# Patient Record
Sex: Female | Born: 1990
Health system: Southern US, Community
[De-identification: ages and names within clinical notes are randomized; demographics above are authoritative.]

---
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
## 2019-10-18 ENCOUNTER — Encounter (HOSPITAL_COMMUNITY): Payer: Self-pay

## 2019-10-18 ENCOUNTER — Ambulatory Visit (HOSPITAL_COMMUNITY)
Admission: EM | Admit: 2019-10-18 | Discharge: 2019-10-18 | Disposition: A | Discharge status: Home / Self Care | Payer: BC Managed Care – PPO | Attending: Urgent Care | Admitting: Urgent Care

## 2019-10-18 ENCOUNTER — Other Ambulatory Visit: Payer: Self-pay

## 2019-10-18 DIAGNOSIS — N926 Irregular menstruation, unspecified: Secondary | ICD-10-CM

## 2019-10-18 DIAGNOSIS — N939 Abnormal uterine and vaginal bleeding, unspecified: Secondary | ICD-10-CM

## 2019-10-18 DIAGNOSIS — K5900 Constipation, unspecified: Secondary | ICD-10-CM | POA: Insufficient documentation

## 2019-10-18 DIAGNOSIS — Z3202 Encounter for pregnancy test, result negative: Secondary | ICD-10-CM

## 2019-10-18 DIAGNOSIS — K644 Residual hemorrhoidal skin tags: Secondary | ICD-10-CM | POA: Insufficient documentation

## 2019-10-18 LAB — POCT URINALYSIS DIP (DEVICE)
Glucose, UA: NEGATIVE mg/dL
Ketones, ur: 80 mg/dL — AB
Leukocytes,Ua: NEGATIVE
Nitrite: NEGATIVE
Protein, ur: NEGATIVE mg/dL
Specific Gravity, Urine: >=1.03 (ref 1.005–1.030)
Urobilinogen, UA: 0.2 mg/dL (ref 0.0–1.0)
pH: 6 (ref 5.0–8.0)

## 2019-10-18 LAB — POC URINE PREG, ED: Preg Test, Ur: NEGATIVE

## 2019-10-18 LAB — POCT PREGNANCY, URINE: Preg Test, Ur: NEGATIVE

## 2019-10-18 MED ORDER — HYDROCORTISONE ACETATE 25 MG RE SUPP: 25.0000 mg | Freq: Two times a day (BID) | RECTAL | 0 refills | Status: DC

## 2019-10-18 MED ORDER — DOCUSATE SODIUM 100 MG PO CAPS: 100.0000 mg | ORAL_CAPSULE | Freq: Two times a day (BID) | ORAL | 0 refills | Status: DC | PRN

## 2019-10-18 NOTE — ED Provider Notes (Signed)
[facility]   MRN: [id number] DOB: 10/20/1990  Subjective:   Katrina Wilson is a 29 y.o. female presenting for irregular vaginal bleeding.  Patient states that she had her regular cycle earlier this month with some mild vaginal spotting toward the end.  On Thursday and Friday, patient had bleeding as if it were her cycle again but did not have any this morning.  She admits that the past couple of months she has had 2 cycles per month.  She does not have any primary care provider, gynecologist.  Denies history of irregular cycles, endometriosis, uterine fibroids, irregular bleeding.  Patient is not on any contraception.  She is not currently taking any medications and has no known food or drug allergies.  Denies past medical and surgical history.   Family History  Problem Relation Age of Onset  . Hypertension Mother   . Diabetes Mother   . Hypertension Father     Social History   Tobacco Use  . Smoking status: Never Smoker  . Smokeless tobacco: Never Used  Substance Use Topics  . Alcohol use: Yes    Comment: occ  . Drug use: Not on file    Review of Systems  Constitutional: Negative for chills and fever.  Respiratory: Negative for shortness of breath.   Cardiovascular: Negative for chest pain.  Gastrointestinal: Positive for blood in stool and constipation. Negative for abdominal pain, diarrhea, nausea and vomiting.  Genitourinary: Negative for dysuria, flank pain, frequency, hematuria and urgency.  Musculoskeletal: Negative for myalgias.  Skin: Negative for rash.  Neurological: Negative for dizziness and headaches.     Objective:   Vitals: BP 136/89 (BP Location: Left Arm)   Pulse 81   Temp 98.6 F (37 C) (Oral)   Resp 16   LMP 10/03/2019 (Exact Date)   SpO2 100%   Physical Exam Constitutional:      General: She is not in acute distress.    Appearance: Normal appearance. She is well-developed and normal weight. She is not ill-appearing,  toxic-appearing or diaphoretic.  HENT:     Head: Normocephalic and atraumatic.     Right Ear: External ear normal.     Left Ear: External ear normal.     Nose: Nose normal.     Mouth/Throat:     Mouth: Mucous membranes are moist.     Pharynx: Oropharynx is clear.  Eyes:     General: No scleral icterus.    Extraocular Movements: Extraocular movements intact.     Pupils: Pupils are equal, round, and reactive to light.  Cardiovascular:     Rate and Rhythm: Normal rate and regular rhythm.     Pulses: Normal pulses.     Heart sounds: Normal heart sounds. No murmur. No friction rub. No gallop.   Pulmonary:     Effort: Pulmonary effort is normal. No respiratory distress.     Breath sounds: Normal breath sounds. No stridor. No wheezing, rhonchi or rales.  Abdominal:     General: Bowel sounds are normal. There is no distension.     Palpations: Abdomen is soft. There is no mass.     Tenderness: There is no abdominal tenderness. There is no right CVA tenderness, left CVA tenderness, guarding or rebound.  Genitourinary:    Labia:        Right: No rash, tenderness, lesion or injury.        Left: No rash, tenderness, lesion or injury.      Vagina: No signs of  injury and foreign body. No vaginal discharge, erythema, tenderness, bleeding, lesions or prolapsed vaginal walls.    Skin:    General: Skin is warm and dry.     Coloration: Skin is not pale.     Findings: No rash.  Neurological:     General: No focal deficit present.     Mental Status: She is alert and oriented to person, place, and time.  Psychiatric:        Mood and Affect: Mood normal.        Behavior: Behavior normal.        Thought Content: Thought content normal.        Judgment: Judgment normal.    Results for orders placed or performed during the hospital encounter of 10/18/19 (from the past 24 hour(s))  POCT urinalysis dip (device)     Status: Abnormal   Collection Time: 10/18/19 11:36 AM  Result Value Ref Range    Glucose, UA NEGATIVE NEGATIVE mg/dL   Bilirubin Urine SMALL (A) NEGATIVE   Ketones, ur 80 (A) NEGATIVE mg/dL   Specific Gravity, Urine >=1.030 1.005 - 1.030   Hgb urine dipstick MODERATE (A) NEGATIVE   pH 6.0 5.0 - 8.0   Protein, ur NEGATIVE NEGATIVE mg/dL   Urobilinogen, UA 0.2 0.0 - 1.0 mg/dL   Nitrite NEGATIVE NEGATIVE   Leukocytes,Ua NEGATIVE NEGATIVE     Assessment and Plan :   1. Vaginal bleeding   2. Irregular periods/menstrual cycles   3. Constipation, unspecified constipation type   4. External hemorrhoids     Emphasized need to hydrate well. Patient was agreeable to holding off on any medication for vaginal bleeding as it may now be resolved.  Recommend that she establish care with a gynecologist for further management and consideration of starting contraception.  Referral information provided to the patient including that for a new PCP. Counseled patient on potential for adverse effects with medications prescribed/recommended today, ER and return-to-clinic precautions discussed, patient verbalized understanding.    Jaynee Eagles, PA-C 10/18/19 1146

## 2019-10-18 NOTE — ED Triage Notes (Addendum)
Patient presents to Urgent Care with complaints of irregular vaginal bleeding since 2-3 months ago. Patient reports she noticed blood in her stool the past few days as well, states the toilet bowl is full of blood when she has a BM.  Pt states she does have a hemorrhoid that she believes is bleeding, blood appears bright red.

## 2019-10-19 LAB — URINE CULTURE: Culture: NO GROWTH

## 2019-10-22 LAB — CERVICOVAGINAL ANCILLARY ONLY
Bacterial vaginitis: NEGATIVE
Candida vaginitis: NEGATIVE
Chlamydia: NEGATIVE
Neisseria Gonorrhea: NEGATIVE
Trichomonas: NEGATIVE

## 2019-12-10 DIAGNOSIS — Z113 Encounter for screening for infections with a predominantly sexual mode of transmission: Secondary | ICD-10-CM | POA: Diagnosis not present

## 2020-06-10 DIAGNOSIS — Z20822 Contact with and (suspected) exposure to covid-19: Secondary | ICD-10-CM | POA: Diagnosis not present

## 2020-06-10 DIAGNOSIS — Z03818 Encounter for observation for suspected exposure to other biological agents ruled out: Secondary | ICD-10-CM | POA: Diagnosis not present

## 2020-07-20 DIAGNOSIS — R109 Unspecified abdominal pain: Secondary | ICD-10-CM | POA: Diagnosis not present

## 2020-07-20 DIAGNOSIS — Z20822 Contact with and (suspected) exposure to covid-19: Secondary | ICD-10-CM | POA: Diagnosis not present

## 2020-07-20 DIAGNOSIS — R6883 Chills (without fever): Secondary | ICD-10-CM | POA: Diagnosis not present

## 2020-07-20 DIAGNOSIS — R110 Nausea: Secondary | ICD-10-CM | POA: Diagnosis not present

## 2020-10-07 ENCOUNTER — Other Ambulatory Visit: Payer: Self-pay

## 2020-10-07 ENCOUNTER — Ambulatory Visit
Admission: RE | Admit: 2020-10-07 | Discharge: 2020-10-07 | Disposition: A | Discharge status: Home / Self Care | Payer: BC Managed Care – PPO | Source: Ambulatory Visit | Attending: Internal Medicine | Admitting: Internal Medicine

## 2020-10-07 VITALS — BP 140/95 | HR 77 | Temp 98.4°F | Resp 16 | Ht 68.0 in | Wt 205.0 lb

## 2020-10-07 DIAGNOSIS — J028 Acute pharyngitis due to other specified organisms: Secondary | ICD-10-CM | POA: Insufficient documentation

## 2020-10-07 DIAGNOSIS — Z1152 Encounter for screening for COVID-19: Secondary | ICD-10-CM | POA: Diagnosis not present

## 2020-10-07 LAB — POCT RAPID STREP A (OFFICE): Rapid Strep A Screen: NEGATIVE

## 2020-10-07 NOTE — Discharge Instructions (Signed)
Increase oral fluid intake Please quarantine until COVID-19 test results available If your symptoms worsen please return to the urgent care to be reevaluated Take medications as tolerated We will call you with recommendations if your labs are abnormal. 

## 2020-10-07 NOTE — ED Triage Notes (Signed)
Pt said nieces had strep throat and she hd been watching her. Now pt has had sore throat x 2days. No fever, slight headache

## 2020-10-08 NOTE — ED Provider Notes (Signed)
[facility]    CSN: [id number] Arrival date & time: 10/07/20  1546      History   Chief Complaint Chief Complaint  Patient presents with  . Sore Throat    HPI Katrina Wilson is a 30 y.o. female comes to urgent care with a 2-day history of sore throat.  Patient says symptoms started suddenly and has been persistent.  She denies any fever or chills.  She complains of slight headache.  No nausea, vomiting or diarrhea.  No loss of taste or smell.  Patient endorses a positive sick contact for strep throat.  She is not vaccinated against COVID-19 virus.   HPI  No past medical history on file.  There are no problems to display for this patient.   No past surgical history on file.  OB History   No obstetric history on file.      Home Medications    Prior to Admission medications   Medication Sig Start Date End Date Taking? Authorizing Provider  docusate sodium (COLACE) 100 MG capsule Take 1 capsule (100 mg total) by mouth 2 (two) times daily as needed for mild constipation or moderate constipation. 10/18/19   Wallis Bamberg, PA-C  hydrocortisone (ANUSOL-HC) 25 MG suppository Place 1 suppository (25 mg total) rectally 2 (two) times daily. 10/18/19   Wallis Bamberg, PA-C    Family History Family History  Problem Relation Age of Onset  . Hypertension Mother   . Diabetes Mother   . Hypertension Father     Social History Social History   Tobacco Use  . Smoking status: Never Smoker  . Smokeless tobacco: Never Used  Vaping Use  . Vaping Use: Never used  Substance Use Topics  . Alcohol use: Yes    Comment: occ     Allergies   Patient has no known allergies.   Review of Systems Review of Systems  Constitutional: Positive for fatigue. Negative for activity change, chills and fever.  HENT: Positive for sore throat.   Respiratory: Negative for cough, shortness of breath and wheezing.   Neurological: Negative.      Physical Exam Triage Vital  Signs ED Triage Vitals  Enc Vitals Group     BP 10/07/20 1615 (!) 140/95     Pulse Rate 10/07/20 1615 77     Resp 10/07/20 1615 16     Temp 10/07/20 1615 98.4 F (36.9 C)     Temp Source 10/07/20 1615 Oral     SpO2 10/07/20 1615 98 %     Weight 10/07/20 1617 205 lb (93 kg)     Height 10/07/20 1617 5\' 8"  (1.727 m)     Head Circumference --      Peak Flow --      Pain Score 10/07/20 1617 4     Pain Loc --      Pain Edu? --      Excl. in GC? --    No data found.  Updated Vital Signs BP (!) 140/95 (BP Location: Right Arm)   Pulse 77   Temp 98.4 F (36.9 C) (Oral)   Resp 16   Ht 5\' 8"  (1.727 m)   Wt 93 kg   LMP 09/24/2020   SpO2 98%   BMI 31.17 kg/m   Visual Acuity Right Eye Distance:   Left Eye Distance:   Bilateral Distance:    Right Eye Near:   Left Eye Near:    Bilateral Near:     Physical Exam Vitals  and nursing note reviewed.  Constitutional:      General: She is not in acute distress.    Appearance: She is not ill-appearing.  HENT:     Right Ear: Tympanic membrane normal.     Left Ear: Tympanic membrane normal.     Mouth/Throat:     Mouth: Mucous membranes are moist. Mucous membranes are pale.  Cardiovascular:     Rate and Rhythm: Normal rate and regular rhythm.  Pulmonary:     Effort: Pulmonary effort is normal.     Breath sounds: Normal breath sounds.  Musculoskeletal:     Cervical back: Normal range of motion.  Neurological:     Mental Status: She is alert.      UC Treatments / Results  Labs (all labs ordered are listed, but only abnormal results are displayed) Labs Reviewed  CULTURE, GROUP A STREP ([facility])  NOVEL CORONAVIRUS, NAA  POCT RAPID STREP A (OFFICE)    EKG   Radiology No results found.  Procedures Procedures (including critical care time)  Medications Ordered in UC Medications - No data to display  Initial Impression / Assessment and Plan / UC Course  I have reviewed the triage vital signs and the nursing  notes.  Pertinent labs & imaging results that were available during my care of the patient were reviewed by me and considered in my medical decision making (see chart for details).     1. Acute pharyngitis: Point-of-care strep is negative Throat culture sent Warm salt water gargle COVID-19 PCR test has been sent Patient is advised to quarantine until COVID-19 test results are available If symptoms worsen patient is advised to return to urgent care to be reevaluated. Final Clinical Impressions(s) / UC Diagnoses   Final diagnoses:  Acute pharyngitis due to other specified organisms     Discharge Instructions     Increase oral fluid intake Please quarantine until COVID-19 test results available If your symptoms worsen please return to the urgent care to be reevaluated Take medications as tolerated We will call you with recommendations if your labs are abnormal.   ED Prescriptions    None     PDMP not reviewed this encounter.   Merrilee Jansky, MD 10/08/20 [phone]

## 2020-10-09 LAB — SARS-COV-2, NAA 2 DAY TAT

## 2020-10-09 LAB — NOVEL CORONAVIRUS, NAA: SARS-CoV-2, NAA: DETECTED — AB

## 2020-10-10 LAB — CULTURE, GROUP A STREP (THRC)

## 2020-10-12 ENCOUNTER — Ambulatory Visit: Payer: Self-pay | Admitting: *Deleted

## 2020-10-12 NOTE — Telephone Encounter (Signed)
Pt called in for the results of her covid test and strep throat test from her ED visit.   I let her know she was positive for covid and went over the care advice with her. Our call got disconnected however I had gone over the protocol and all with her.  I let her know the health dept would be notified then the line was disconnected.    Reason for Disposition . [1] COVID-19 diagnosed by doctor (or NP/PA) AND [2] mild symptoms (e.g., cough, fever, others) AND [3] no complications or SOB  Answer Assessment - Initial Assessment Questions 1. COVID-19 DIAGNOSIS: "Who made your COVID-19 diagnosis?" "Was it confirmed by a positive lab test?" If not diagnosed by a HCP, ask "Are there lots of cases (community spread) where you live?" Note: See public health department website, if unsure.     Ed was tested now positive for covid. 2. COVID-19 EXPOSURE: "Was there any known exposure to COVID before the symptoms began?" CDC Definition of close contact: within 6 feet (2 meters) for a total of 15 minutes or more over a 24-hour period.      symptoms 3. ONSET: "When did the COVID-19 symptoms start?"      Last Wed.   4. WORST SYMPTOM: "What is your worst symptom?" (e.g., cough, fever, shortness of breath, muscle aches)     Sore throat. 5. COUGH: "Do you have a cough?" If Yes, ask: "How bad is the cough?"       No 6. FEVER: "Do you have a fever?" If Yes, ask: "What is your temperature, how was it measured, and when did it start?"     No 7. RESPIRATORY STATUS: "Describe your breathing?" (e.g., shortness of breath, wheezing, unable to speak)      Fine 8. BETTER-SAME-WORSE: "Are you getting better, staying the same or getting worse compared to yesterday?"  If getting worse, ask, "In what way?"     Better 9. HIGH RISK DISEASE: "Do you have any chronic medical problems?" (e.g., asthma, heart or lung disease, weak immune system, obesity, etc.)     Call got disconnected at this point.   I had gone over the quarantine  protocol with her at this point. 10. VACCINE: "Have you gotten the COVID-19 vaccine?" If Yes ask: "Which one, how many shots, when did you get it?"       Not vaccinated for covid 11. PREGNANCY: "Is there any chance you are pregnant?" "When was your last menstrual period?"       Not asked 12. OTHER SYMPTOMS: "Do you have any other symptoms?"  (e.g., chills, fatigue, headache, loss of smell or taste, muscle pain, sore throat; new loss of smell or taste especially support the diagnosis of COVID-19)       Sore throat was only symptom she had.  Protocols used: CORONAVIRUS (COVID-19) DIAGNOSED OR SUSPECTED-A-AH

## 2020-10-22 DIAGNOSIS — Z114 Encounter for screening for human immunodeficiency virus [HIV]: Secondary | ICD-10-CM | POA: Diagnosis not present

## 2020-10-22 DIAGNOSIS — Z113 Encounter for screening for infections with a predominantly sexual mode of transmission: Secondary | ICD-10-CM | POA: Diagnosis not present

## 2020-10-22 DIAGNOSIS — N760 Acute vaginitis: Secondary | ICD-10-CM | POA: Diagnosis not present

## 2020-11-26 ENCOUNTER — Ambulatory Visit: Payer: BC Managed Care – PPO | Admitting: Obstetrics & Gynecology

## 2021-09-14 ENCOUNTER — Ambulatory Visit (INDEPENDENT_AMBULATORY_CARE_PROVIDER_SITE_OTHER): Payer: BC Managed Care – PPO | Admitting: Obstetrics & Gynecology

## 2021-09-14 ENCOUNTER — Other Ambulatory Visit (HOSPITAL_COMMUNITY)
Admission: RE | Admit: 2021-09-14 | Discharge: 2021-09-14 | Disposition: A | Discharge status: Home / Self Care | Payer: BC Managed Care – PPO | Source: Ambulatory Visit | Attending: Obstetrics & Gynecology | Admitting: Obstetrics & Gynecology

## 2021-09-14 ENCOUNTER — Encounter: Payer: Self-pay | Admitting: Obstetrics & Gynecology

## 2021-09-14 ENCOUNTER — Other Ambulatory Visit: Payer: Self-pay

## 2021-09-14 VITALS — BP 135/78 | HR 60 | Ht 68.0 in | Wt 216.0 lb

## 2021-09-14 DIAGNOSIS — Z113 Encounter for screening for infections with a predominantly sexual mode of transmission: Secondary | ICD-10-CM

## 2021-09-14 DIAGNOSIS — N939 Abnormal uterine and vaginal bleeding, unspecified: Secondary | ICD-10-CM | POA: Diagnosis not present

## 2021-09-14 NOTE — Progress Notes (Signed)
Patient complaining about irregular periods. Patient states she has multiple periods in a month. Patient states that periods last 3-5 days. Patient was about 37-28 years old when she started menstruation. Patient reports last pap smear was Oct 22, 2020 at [facility].Armandina Stammer RN

## 2021-09-14 NOTE — Progress Notes (Signed)
History:  30 y.o. G0P0000 here today for irreg menses for 5 months. Prior to that pt reports monthly cycles lasting 5 days. Pt reports 2-3 'cycles' per month. She is a G0. Pt denies being sexually active since Jan.2021. Was prev sexually active males. She reports being screened for STIs in Jan [facility] at the [facility]. She used condoms for protection. She denies prev medical problems. She also denies pain with menses or vaginal discharge.     The following portions of the patient's history were reviewed and updated as appropriate: allergies, current medications, past family history, past medical history, past social history, past surgical history and problem list.  Review of Systems:  Pertinent items are noted in HPI.    Objective:  Physical Exam Blood pressure 135/78, pulse 60, height 5\' 8"  (1.727 m), weight 216 lb (98 kg).  CONSTITUTIONAL: Well-developed, well-nourished female in no acute distress.  HENT:  Normocephalic, atraumatic EYES: Conjunctivae and EOM are normal. No scleral icterus.  NECK: Normal range of motion SKIN: Skin is warm and dry. No rash noted. Not diaphoretic.No pallor. NEUROLGIC: Alert and oriented to person, place, and time. Normal coordination.  Abd: Soft, nontender and nondistended; umbilical piercings.  Pelvic: Normal appearing external genitalia; normal appearing vaginal mucosa and cervix.  Normal discharge.  Small uterus, no other palpable masses, no uterine or adnexal tenderness   Assessment & Plan:  Stehanie was seen today for menstrual problem.  Diagnoses and all orders for this visit:  Abnormal uterine bleeding -     Pelvis Complete; Future -     Cervicovaginal ancillary only( [place]) -     TSH -     CBC  Routine screening for STI (sexually transmitted infection)   Pt was offered OCPs to control her cycle. She wants to wait for the results to come back. She is on MyChart and is ok to communicate via MyChart.  F/u in 3 months in ofc.   Total  face-to-face time with patient, review of chart, discussion with consultant and coordination of care was [place].    Jamey Harman L. Harraway-Smith, M.D., 

## 2021-09-15 LAB — CBC
Hematocrit: 35.4 % (ref 34.0–46.6)
Hemoglobin: 10.7 g/dL — ABNORMAL LOW (ref 11.1–15.9)
MCH: 24.7 pg — ABNORMAL LOW (ref 26.6–33.0)
MCHC: 30.2 g/dL — ABNORMAL LOW (ref 31.5–35.7)
MCV: 82 fL (ref 79–97)
Platelets: 270 10*3/uL (ref 150–450)
RBC: 4.33 x10E6/uL (ref 3.77–5.28)
RDW: 15 % (ref 11.7–15.4)
WBC: 6.4 10*3/uL (ref 3.4–10.8)

## 2021-09-15 LAB — TSH: TSH: 1.63 u[IU]/mL (ref 0.450–4.500)

## 2021-09-16 LAB — CERVICOVAGINAL ANCILLARY ONLY
Chlamydia: NEGATIVE
Comment: NEGATIVE
Comment: NEGATIVE
Comment: NORMAL
Neisseria Gonorrhea: NEGATIVE
Trichomonas: NEGATIVE

## 2021-09-17 ENCOUNTER — Ambulatory Visit (HOSPITAL_BASED_OUTPATIENT_CLINIC_OR_DEPARTMENT_OTHER)
Admission: RE | Admit: 2021-09-17 | Discharge: 2021-09-17 | Disposition: A | Discharge status: Home / Self Care | Payer: BC Managed Care – PPO | Source: Ambulatory Visit | Attending: Obstetrics & Gynecology | Admitting: Obstetrics & Gynecology

## 2021-09-17 ENCOUNTER — Encounter (HOSPITAL_BASED_OUTPATIENT_CLINIC_OR_DEPARTMENT_OTHER): Payer: Self-pay

## 2021-09-17 ENCOUNTER — Other Ambulatory Visit: Payer: Self-pay

## 2021-09-17 DIAGNOSIS — N939 Abnormal uterine and vaginal bleeding, unspecified: Secondary | ICD-10-CM

## 2021-09-22 ENCOUNTER — Ambulatory Visit (HOSPITAL_BASED_OUTPATIENT_CLINIC_OR_DEPARTMENT_OTHER)
Admission: RE | Admit: 2021-09-22 | Discharge: 2021-09-22 | Disposition: A | Discharge status: Home / Self Care | Payer: BC Managed Care – PPO | Source: Ambulatory Visit | Attending: Obstetrics & Gynecology | Admitting: Obstetrics & Gynecology

## 2021-09-22 ENCOUNTER — Other Ambulatory Visit: Payer: Self-pay

## 2021-09-22 DIAGNOSIS — N939 Abnormal uterine and vaginal bleeding, unspecified: Secondary | ICD-10-CM | POA: Insufficient documentation

## 2021-09-22 DIAGNOSIS — R9389 Abnormal findings on diagnostic imaging of other specified body structures: Secondary | ICD-10-CM | POA: Diagnosis not present

## 2021-09-22 DIAGNOSIS — N8500 Endometrial hyperplasia, unspecified: Secondary | ICD-10-CM | POA: Diagnosis not present

## 2021-09-22 DIAGNOSIS — R188 Other ascites: Secondary | ICD-10-CM | POA: Diagnosis not present

## 2021-12-14 ENCOUNTER — Encounter: Payer: Self-pay | Admitting: Obstetrics & Gynecology

## 2021-12-14 ENCOUNTER — Ambulatory Visit (INDEPENDENT_AMBULATORY_CARE_PROVIDER_SITE_OTHER): Payer: BC Managed Care – PPO | Admitting: Obstetrics & Gynecology

## 2021-12-14 ENCOUNTER — Other Ambulatory Visit: Payer: Self-pay

## 2021-12-14 VITALS — BP 138/89 | HR 70 | Wt 206.0 lb

## 2021-12-14 DIAGNOSIS — N939 Abnormal uterine and vaginal bleeding, unspecified: Secondary | ICD-10-CM | POA: Diagnosis not present

## 2021-12-14 MED ORDER — NORETHINDRONE ACET-ETHINYL EST 1-20 MG-MCG PO TABS: 1.0000 | ORAL_TABLET | Freq: Every day | ORAL | 11 refills | Status: AC

## 2021-12-14 NOTE — Progress Notes (Signed)
History:  ?31 y.o. G0P0000 here today for f/u of [place] and labs.  Pt reports continued abnormal bleeding.  ? ?The following portions of the patient's history were reviewed and updated as appropriate: allergies, current medications, past family history, past medical history, past social history, past surgical history and problem list. ? ?Review of Systems:  ?Pertinent items are noted in HPI. ?   ?Objective:  ?Physical Exam ?Blood pressure 138/89, pulse 70, weight 206 lb (93.4 kg), last menstrual period 12/01/2021. ? ?CONSTITUTIONAL: Well-developed, well-nourished female in no acute distress.  ?HENT:  Normocephalic, atraumatic ?EYES: Conjunctivae and EOM are normal. No scleral icterus.  ?NECK: Normal range of motion ?SKIN: Skin is warm and dry. No rash noted. Not diaphoretic.No pallor. ?NEUROLGIC: Alert and oriented to person, place, and time. Normal coordination.   ?Pelvic exam: deferred ? ?Labs and Imaging ?09/22/2021 ?CLINICAL DATA:  Abnormal vaginal bleeding. ?  ?EXAM: ?TRANSABDOMINAL ULTRASOUND OF PELVIS ?  ?TECHNIQUE: ?Transabdominal ultrasound examination of the pelvis was performed ?including evaluation of the uterus, ovaries, adnexal regions, and ?pelvic cul-de-sac. ?  ?COMPARISON:  None. ?  ?FINDINGS: ?Uterus ?  ?Measurements: 8.4 cm x 4.8 cm x 5.7 cm = volume: 121.5 mL. No ?fibroids or other mass visualized. ?  ?Endometrium ?  ?Thickness: 15.0 mm.  No focal abnormality visualized. ?  ?Right ovary ?  ?Measurements: 2.7 cm x 2.2 cm x 1.6 cm = volume: 4.8 mL. Normal ?appearance/no adnexal mass. ?  ?Left ovary ?  ?Measurements: 3.7 cm x 1.9 cm x 1.9 cm = volume: 3.2 mL. Normal ?appearance/no adnexal mass. ?  ?Other findings:  A trace amount of pelvic free fluid is seen. ?  ?IMPRESSION: ?1. Thickened endometrium. ?2. Trace amount of pelvic free fluid, likely physiologic. ?  ? ?Assessment & Plan:  ?AUB- ? ?Kaydence was seen today for follow-up. ? ?Diagnoses and all orders for this visit: ? ?Abnormal uterine bleeding  (AUB) ?-     norethindrone-ethinyl estradiol (LOESTRIN 1/20, 21,) 1-20 MG-MCG tablet; Take 1 tablet by mouth daily. ? ? F/u 3 months.  ? ?Zaneta Lightcap L. Harraway-Smith, M.D., FACOG ? ? ?

## 2021-12-14 NOTE — Patient Instructions (Signed)
Oral Contraception Information Oral contraceptive pills (OCPs) are medicines taken by mouth to prevent pregnancy. They work by: Preventing the ovaries from releasing eggs. Thickening mucus in the lower part of the uterus (cervix). This prevents sperm from entering the uterus. Thinning the lining of the uterus (endometrium). This prevents a fertilized egg from attaching to the endometrium. OCPs are highly effective when taken exactly as prescribed. However, OCPs do not prevent STIs (sexually transmitted infections). Using condoms while on an OCP can help prevent STIs. What happens before starting OCPs? Before you start taking OCPs: You may have a physical exam, blood test, and Pap test. Your health care provider will make sure you are a good candidate for oral contraception. OCPs are not a good option for certain women, such as: Women who smoke and are older than age 35. Women who have or have had certain conditions, such as: A history of high blood pressure. Deep vein thrombosis. Pulmonary embolism. Stroke. Cardiovascular disease. Peripheral vascular disease. Ask your health care provider about the possible side effects of the OCP you may be prescribed. Be aware that it can take 2-3 months for your body to adjustto changes in hormone levels. Types of oral contraception  Birth control pills contain the hormones estrogen and progestin (synthetic progesterone) or progestin only. The combination pill This type of pill contains estrogen and progestin hormones. Conventional contraception pills come in packs of 21 or 28 pills. Some packs with 28-day pills contain estrogen and progestin for the first 21-24 days. Hormone-free tablets, called placebos, are taken for the final 4-7 days. You should have menstrual bleeding during the time you take the placebos. In packs with 21 tablets, you take no pills for 7 days. Menstrual bleeding occurs during these days. (Some people prefer taking a pill for 28  days to help establish a routine). Extended-interval contraception pills come in packs of 91 pills. The first 84 tablets have both estrogen and progestin. The last 7 pills are placebos. Menstrual bleeding occurs during the placebo days. With this schedule, menstrual bleeding happens once every 3 months. Continuous contraception pills come in packs of 28 pills. All pills in the pack contain estrogen and progestin. With this schedule, regular menstrual bleeding does not happen, but there may be spotting or irregular bleeding. Progestin-only pills This type of pill is often called the mini-pill and contains the progestin hormone only. It comes in packs of 28 pills. In some packs, the last 4 pills are placebos. The pill must be taken at the same time every day. This is very important to prevent pregnancy. Menstrual bleeding may not be regular orpredictable. What are the advantages? Oral contraception provides reliable and continuous contraception if taken as directed. It may treat or decrease symptoms of: Menstrual period cramps. Irregular menstrual cycle or bleeding. Heavy menstrual flow. Abnormal uterine bleeding. Acne, depending on the type of pill. Polycystic ovarian syndrome (POS). Endometriosis. Iron deficiency anemia. Premenstrual symptoms, including severe irritability, depression, or anxiety. It also may: Reduce the risk of endometrial and ovarian cancer. Be used as emergency contraception. Prevent ectopic pregnancies and infections of the fallopian tubes. What can make OCPs less effective? OCPs may be less effective if: You forget to take the pill every day. For progestin-only pills, it is especially important to take the pill at the same time each day. Even taking it 3 hours late can increase the risk of pregnancy. You have a stomach or intestinal disease that reduces your body's ability to absorb the pill. You take   OCPs with other medicines that make OCPs less effective, such as  antibiotics, certain HIV medicines, and some seizure medicines. You take expired OCPs. You forget to restart the pill after 7 days of not taking it. This refers to the packs of 21 pills. What are the side effects and risks? OCPs can sometimes cause side effects, such as: Headache. Depression. Trouble sleeping. Nausea and vomiting. Breast tenderness. Irregular bleeding or spotting during the first several months. Bloating or fluid retention. Increase in blood pressure. Combination pills may slightly increase the risk of: Blood clots. Heart attack. Stroke. Follow these instructions at home: Follow instructions from your health care provider about how to start taking your first cycle of OCPs. Depending on when you start the pill, you may need to use a backup form of birth control, such as condoms, during the first week.Make sure you know what steps to take if you forget to take the pill. Summary Oral contraceptive pills (OCPs) are medicines taken by mouth to prevent pregnancy. They are highly effective when taken exactly as prescribed. OCPs contain a combination of the hormones estrogen and progestin (synthetic progesterone) or progestin only. Before you start taking the pill, you may have a physical exam, blood test, and Pap test. Your health care provider will make sure you are a good candidate for oral contraception. The combination pill may come in a 21-day pack, a 28-day pack, or a 91-day pack. Progestin-only pills come in packs of 28 pills. OCPs can sometimes cause side effects, such as headache, nausea, breast tenderness, or irregular bleeding. This information is not intended to replace advice given to you by your health care provider. Make sure you discuss any questions you have with your healthcare provider. Document Revised: 06/18/2020 Document Reviewed: 05/27/2020 Elsevier Patient Education  2022 [employer]  

## 2022-01-05 ENCOUNTER — Encounter: Payer: Self-pay | Admitting: Obstetrics & Gynecology

## 2022-05-05 DIAGNOSIS — Z114 Encounter for screening for human immunodeficiency virus [HIV]: Secondary | ICD-10-CM | POA: Diagnosis not present

## 2022-05-05 DIAGNOSIS — Z113 Encounter for screening for infections with a predominantly sexual mode of transmission: Secondary | ICD-10-CM | POA: Diagnosis not present

## 2022-11-15 IMAGING — US US PELVIS COMPLETE
1 series · 14 of 25 positions shown · non-contrast
Comparison: None.

CLINICAL DATA: Abnormal vaginal bleeding.

EXAM:
TRANSABDOMINAL ULTRASOUND OF PELVIS
TECHNIQUE: Transabdominal ultrasound examination of the pelvis was performed
including evaluation of the uterus, ovaries, adnexal regions, and
pelvic cul-de-sac.

[Series 1: us pelvis complete · 46 acquisitions, 14 frames shown]
[im 1/46]
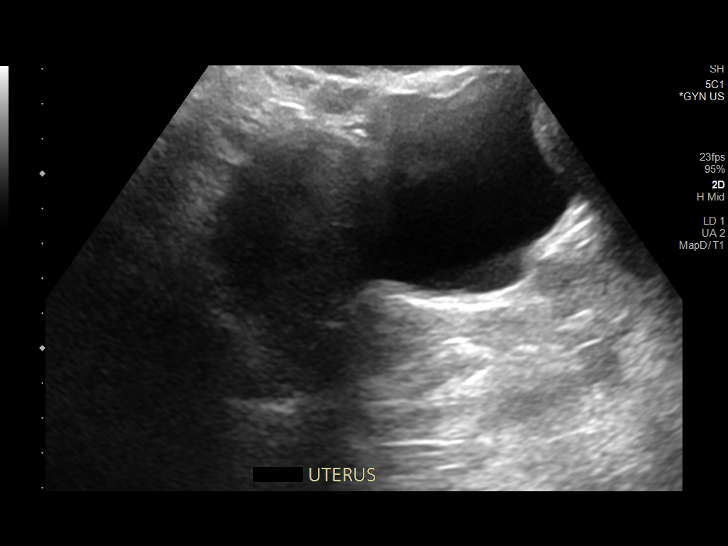
[im 4/46]
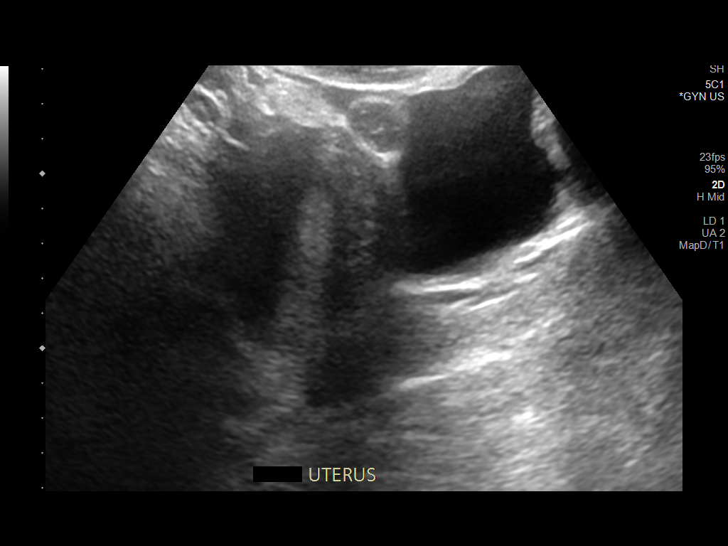
[im 8/46]
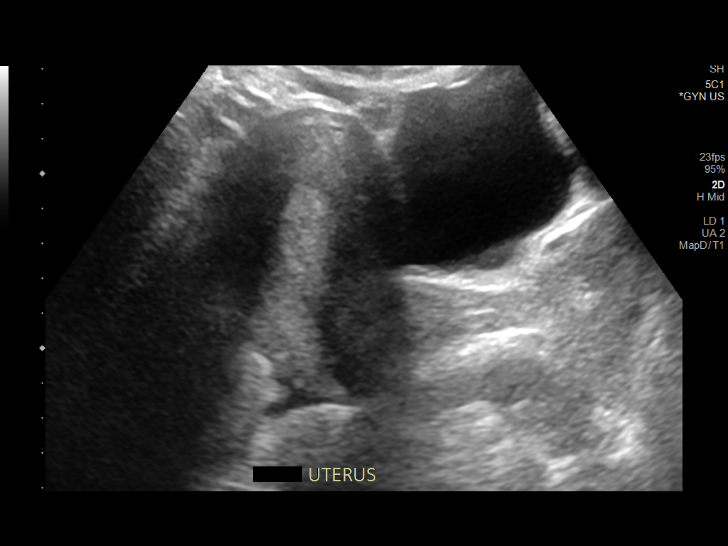
[im 12/46]
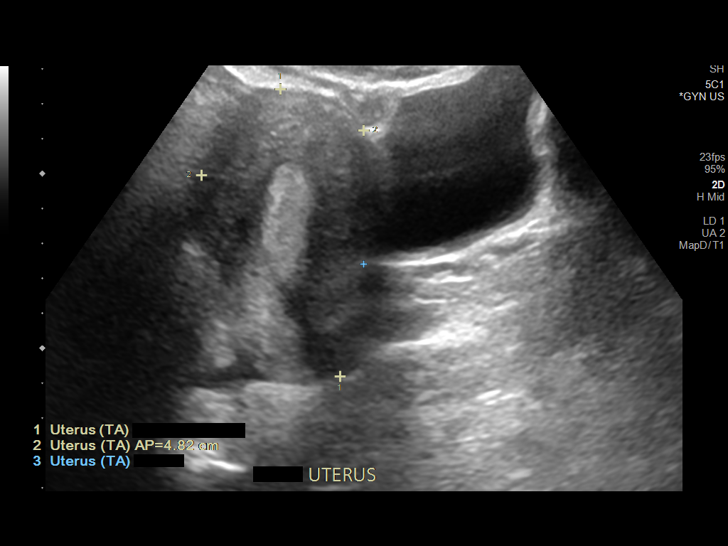
[im 16/46]
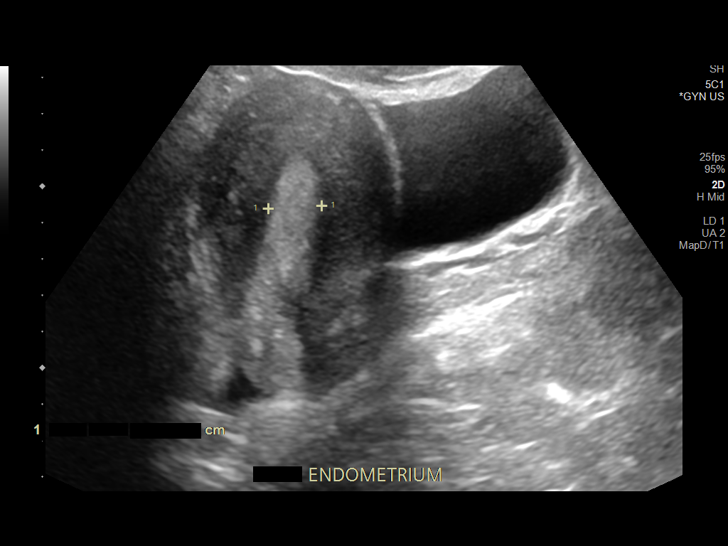
[im 17/46]
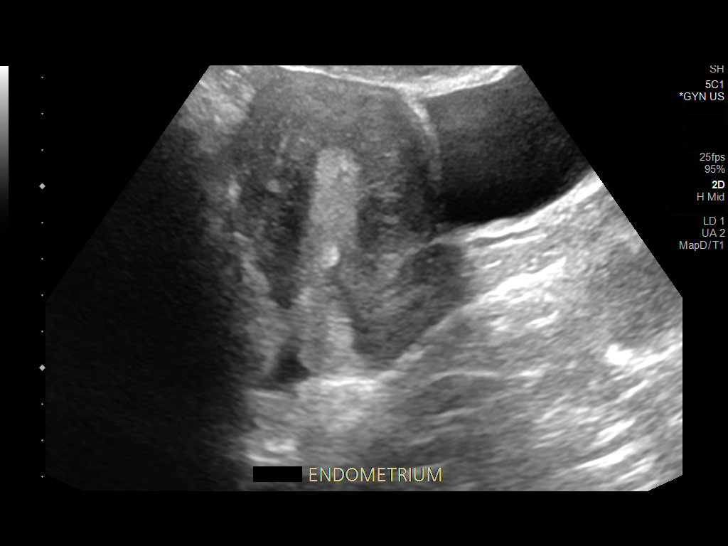
[im 21/46]
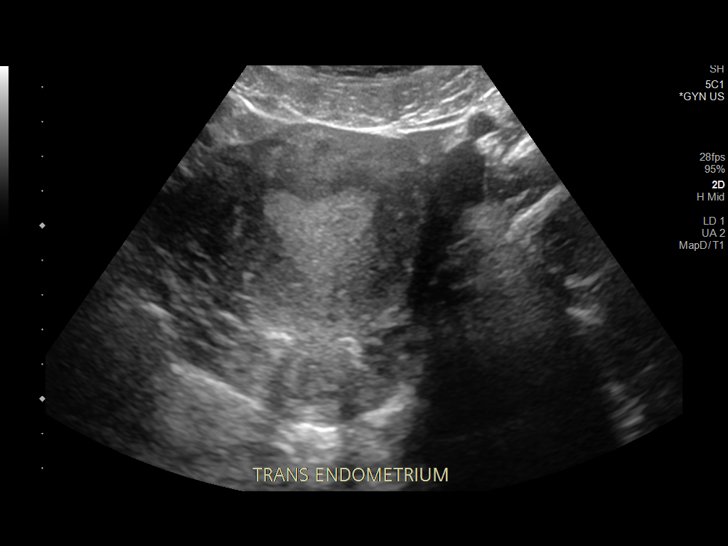
[im 25/46]
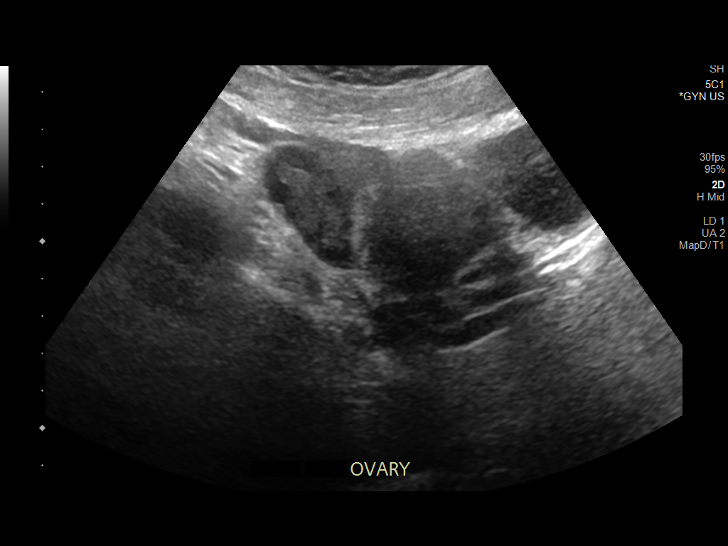
[im 29/46]
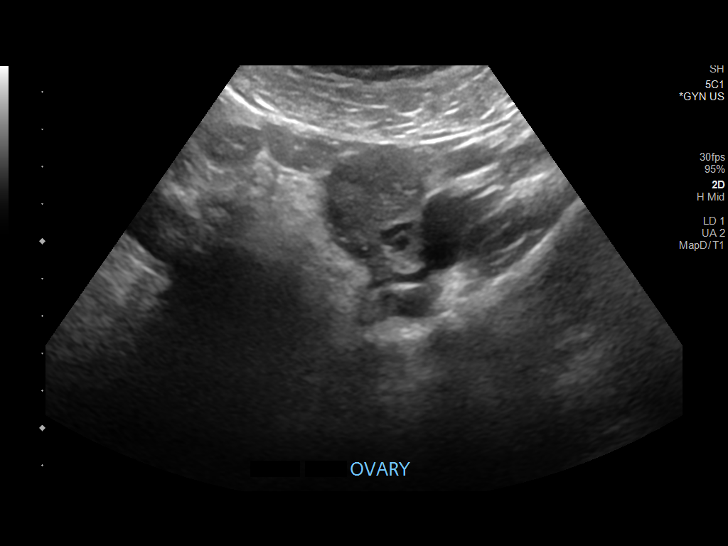
[im 31/46]
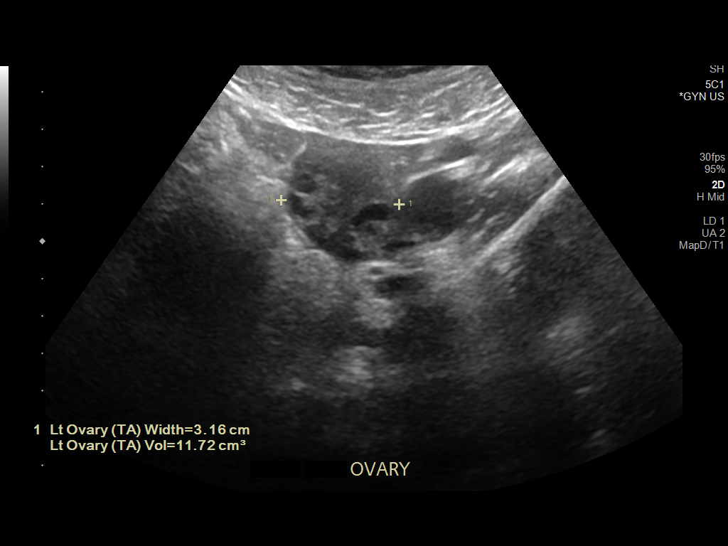
[im 34/46]
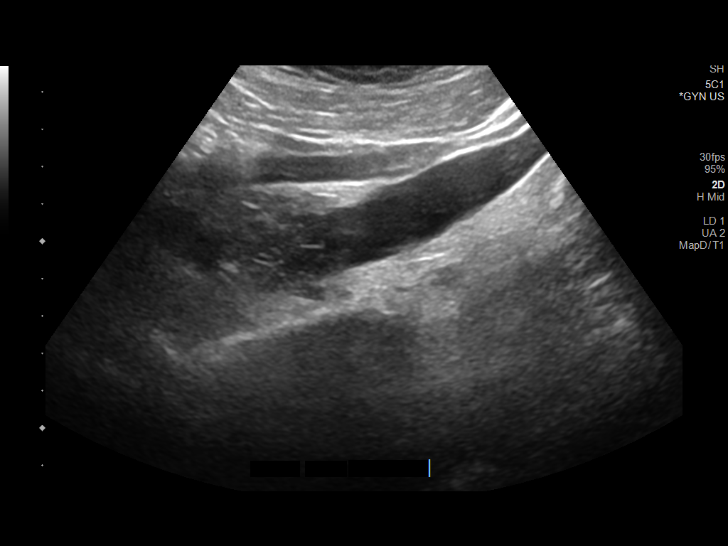
[im 38/46]
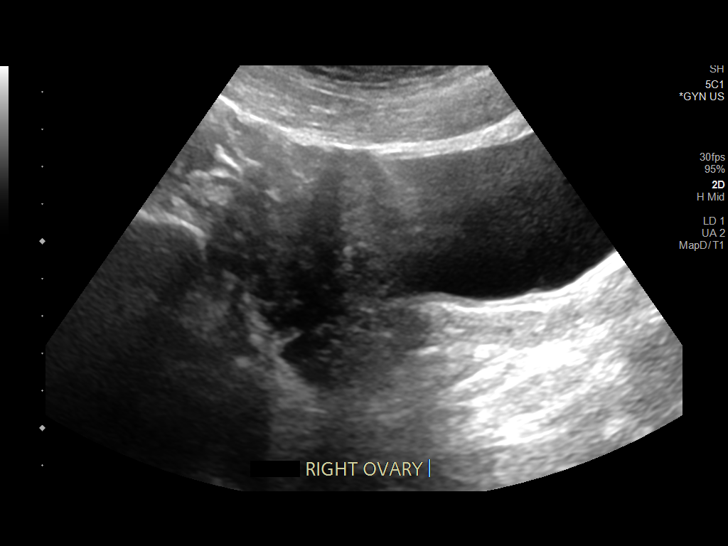
[im 42/46]
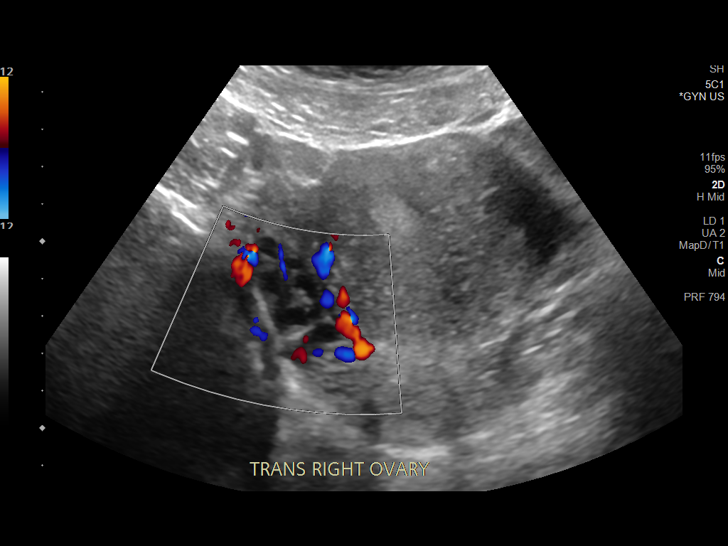
[im 46/46]
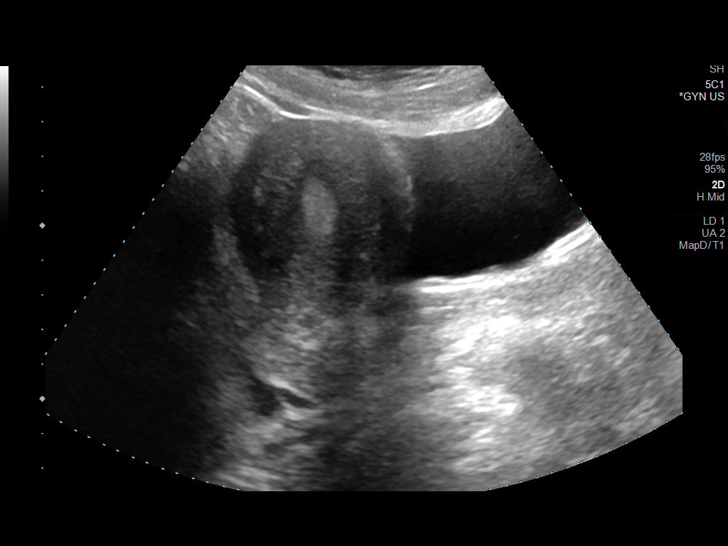

[14 of 25 positions shown; findings below may reference images not displayed]

FINDINGS: Uterus

Measurements: 8.4 cm x 4.8 cm x 5.7 cm = volume: 121.5 mL. No
fibroids or other mass visualized.

Endometrium

Thickness: 15.0 mm.  No focal abnormality visualized.

Right ovary

Measurements: 2.7 cm x 2.2 cm x 1.6 cm = volume: 4.8 mL. Normal
appearance/no adnexal mass.

Left ovary

Measurements: 3.7 cm x 1.9 cm x 1.9 cm = volume: 3.2 mL. Normal
appearance/no adnexal mass.

Other findings:  A trace amount of pelvic free fluid is seen.
IMPRESSION: 1. Thickened endometrium.
2. Trace amount of pelvic free fluid, likely physiologic.

## 2023-02-15 ENCOUNTER — Encounter (HOSPITAL_BASED_OUTPATIENT_CLINIC_OR_DEPARTMENT_OTHER): Payer: Self-pay | Admitting: Emergency Medicine

## 2023-02-15 ENCOUNTER — Other Ambulatory Visit: Payer: Self-pay

## 2023-02-15 ENCOUNTER — Emergency Department (HOSPITAL_BASED_OUTPATIENT_CLINIC_OR_DEPARTMENT_OTHER)
Admission: EM | Admit: 2023-02-15 | Discharge: 2023-02-16 | Disposition: A | Discharge status: Home / Self Care | Payer: BC Managed Care – PPO | Attending: Emergency Medicine | Admitting: Emergency Medicine

## 2023-02-15 DIAGNOSIS — B9689 Other specified bacterial agents as the cause of diseases classified elsewhere: Secondary | ICD-10-CM | POA: Diagnosis not present

## 2023-02-15 DIAGNOSIS — H1032 Unspecified acute conjunctivitis, left eye: Secondary | ICD-10-CM | POA: Diagnosis not present

## 2023-02-15 DIAGNOSIS — H5712 Ocular pain, left eye: Secondary | ICD-10-CM | POA: Diagnosis not present

## 2023-02-15 MED ORDER — FLUORESCEIN SODIUM 1 MG OP STRP
1.0000 | ORAL_STRIP | Freq: Once | OPHTHALMIC | Status: AC
  Administered 2023-02-16: 1 via OPHTHALMIC
  Filled 2023-02-15: qty 1

## 2023-02-15 MED ORDER — TETRACAINE HCL 0.5 % OP SOLN
2.0000 [drp] | Freq: Once | OPHTHALMIC | Status: AC
  Administered 2023-02-16: 2 [drp] via OPHTHALMIC
  Filled 2023-02-15: qty 4

## 2023-02-15 NOTE — ED Triage Notes (Signed)
Pt reports she was at work when she started having left eye irritation, states it "felt like something was trapped under the eyelid", eye is swollen, red, and watery

## 2023-02-16 DIAGNOSIS — H16202 Unspecified keratoconjunctivitis, left eye: Secondary | ICD-10-CM | POA: Diagnosis not present

## 2023-02-16 MED ORDER — ERYTHROMYCIN 5 MG/GM OP OINT
TOPICAL_OINTMENT | Freq: Four times a day (QID) | OPHTHALMIC | Status: DC
  Administered 2023-02-16: 1 via OPHTHALMIC
  Filled 2023-02-16: qty 3.5

## 2023-02-16 NOTE — ED Provider Notes (Signed)
  [place] [place] Provider Note   CSN: [id number] Arrival date & time: 02/15/23  2301     History  Chief Complaint  Patient presents with   Foreign Body in Eye    Katrina Wilson is a 32 y.o. female.  The history is provided by the patient.  Patient presents for concern for foreign body in left eye.  Patient reports approximately 12 hours ago she was at work when she started feeling something in her left eye. No trauma to her eye.  She does not wear contact lenses.  No previous eye surgery.  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 She now reports pain, redness and drainage from the eye     Home Medications Prior to Admission medications   Medication Sig Start Date End Date Taking? Authorizing Provider  norethindrone-ethinyl estradiol (LOESTRIN 1/20, 21,) 1-20 MG-MCG tablet Take 1 tablet by mouth daily. 12/14/21   Willodean Rosenthal, MD      Allergies    Patient has no known allergies.    Review of Systems   Review of Systems  Physical Exam Updated Vital Signs BP 130/84 (BP Location: Right Arm)   Pulse 72   Temp 98.3 F (36.8 C) (Oral)   Resp 17   Ht 1.727 m (5\' 8" )   Wt 88.5 kg   LMP 02/09/2023   SpO2 99%   BMI 29.65 kg/m  Physical Exam CONSTITUTIONAL: Well developed/well nourished HEAD: Normocephalic/atraumatic EYES: EOMI/PERRL No proptosis. IOP OS 20-24 No obvious foreign bodies.  No large corneal abrasions in OS. Small amount of conjunctival discharge. Conjunctival erythema is noted. No corneal haziness. NEURO: Pt is awake/alert/appropriate, moves all extremitiesx4.  No facial droop.   SKIN: warm, color normal PSYCH: no abnormalities of mood noted, alert and oriented to situation  ED Results / Procedures / Treatments   Labs (all labs ordered are listed, but only abnormal results are displayed) Labs Reviewed - No data to display  EKG None  Radiology No  results found.  Procedures Procedures    Medications Ordered in ED Medications  erythromycin ophthalmic ointment (1 Application Left Eye Given 02/16/23 0051)  tetracaine (PONTOCAINE) 0.5 % ophthalmic solution 2 drop (2 drops Left Eye Given 02/16/23 0014)  fluorescein ophthalmic strip 1 strip (1 strip Left Eye Given 02/16/23 0014)    ED Course/ Medical Decision Making/ A&P                             Medical Decision Making Risk Prescription drug management.   Patient presents for eye pain and swelling concern for foreign body.  No obvious foreign body or abrasion.  Visual acuity was very limited due to her pain.  She did have immediate pain relief upon use of tetracaine Plan to use erythromycin every 6 hours.  Follow-up with ophthalmology early next week if no improvement. Will initially treat for conjunctivitis.        Final Clinical Impression(s) / ED Diagnoses Final diagnoses:  Acute bacterial conjunctivitis of left eye    Rx / DC Orders ED Discharge Orders     None         Zadie Rhine, MD 02/16/23 [phone]

## 2023-02-16 NOTE — Discharge Instructions (Addendum)
You can use the eye ointment in your left eye every 6 hours through the weekend.  If there is no improvement, please call the doctor listed Monday morning for close follow-up

## 2023-04-03 DIAGNOSIS — Z111 Encounter for screening for respiratory tuberculosis: Secondary | ICD-10-CM | POA: Diagnosis not present

## 2023-04-04 DIAGNOSIS — Z23 Encounter for immunization: Secondary | ICD-10-CM | POA: Diagnosis not present

## 2023-04-04 DIAGNOSIS — Z0184 Encounter for antibody response examination: Secondary | ICD-10-CM | POA: Diagnosis not present

## 2023-04-06 DIAGNOSIS — Z111 Encounter for screening for respiratory tuberculosis: Secondary | ICD-10-CM | POA: Diagnosis not present

## 2023-04-06 DIAGNOSIS — Z0184 Encounter for antibody response examination: Secondary | ICD-10-CM | POA: Diagnosis not present

## 2023-04-13 DIAGNOSIS — Z111 Encounter for screening for respiratory tuberculosis: Secondary | ICD-10-CM | POA: Diagnosis not present

## 2023-04-15 DIAGNOSIS — Z111 Encounter for screening for respiratory tuberculosis: Secondary | ICD-10-CM | POA: Diagnosis not present
